# Patient Record
Sex: Male | Born: 1995 | Hispanic: Yes | Marital: Single | Smoking: Never smoker
Health system: Southern US, Community
[De-identification: ages and names within clinical notes are randomized; demographics above are authoritative.]

---
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
## 2015-01-04 IMAGING — CT CT ANGIO CHEST
2 of 8 series · 18 of 46 positions shown · IV contrast (Omni 300)
Comparison: None.

CLINICAL DATA: Dyspnea after recent femur fracture. Elevated
D-dimer.

EXAM:
CT ANGIOGRAPHY CHEST WITH CONTRAST
TECHNIQUE: Multidetector CT imaging of the chest was performed using the
standard protocol during bolus administration of intravenous
contrast. Multiplanar CT image reconstructions and MIPs were
obtained to evaluate the vascular anatomy.
CONTRAST:  100mL OMNIPAQUE IOHEXOL 350 MG/ML SOLN

[Series 5: thins · axial · 0.65mm/px · z∈[+1341,+1574]mm · 15 of 257 slices shown]
[im 12/257  lung]
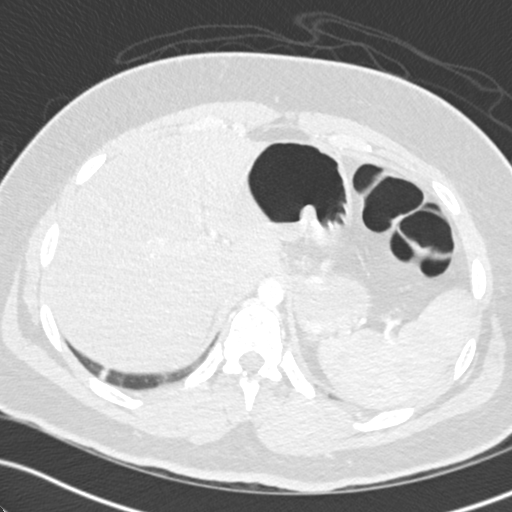
[im 35/257  soft-tissue]
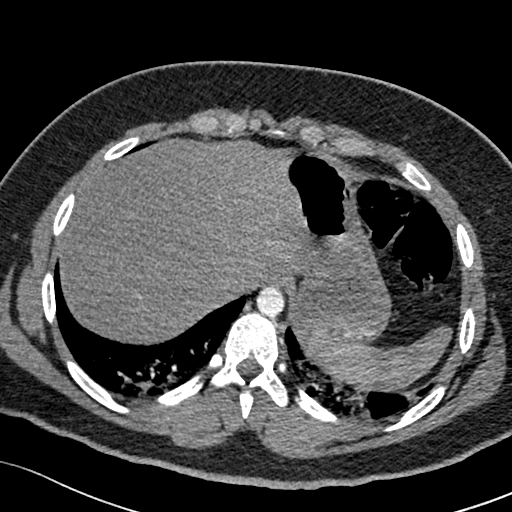
[im 47/257  lung]
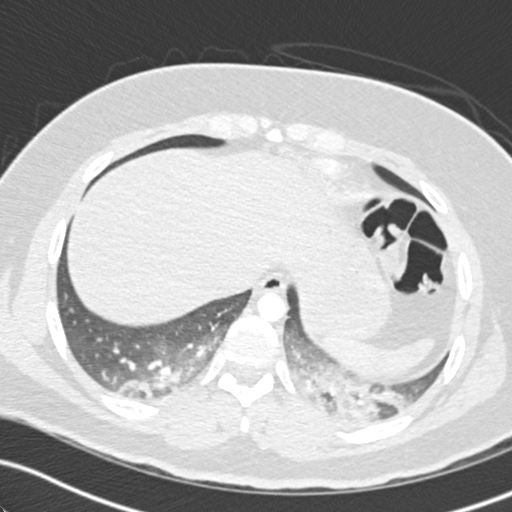
[im 59/257  soft-tissue]
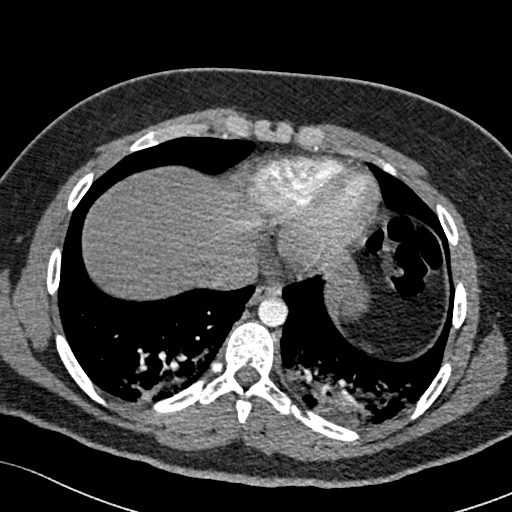
[im 82/257  lung]
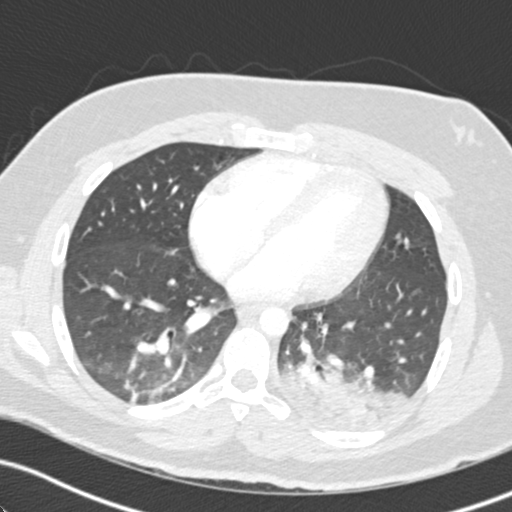
[im 94/257  soft-tissue]
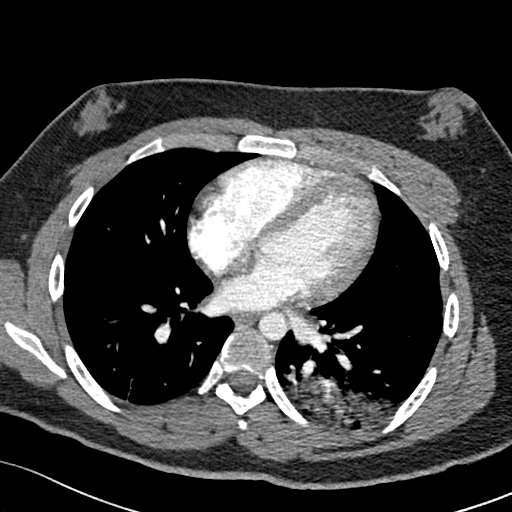
[im 117/257  lung]
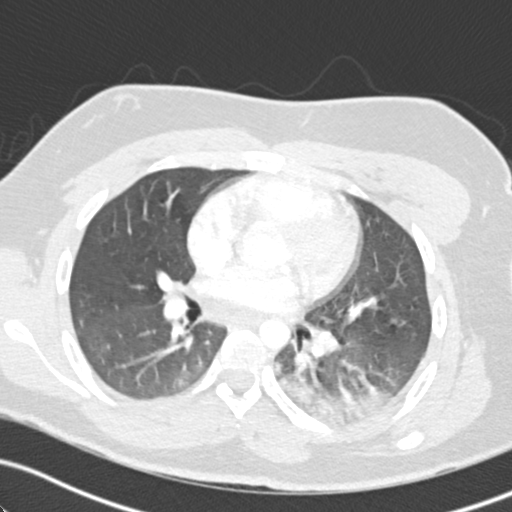
[im 129/257  soft-tissue]
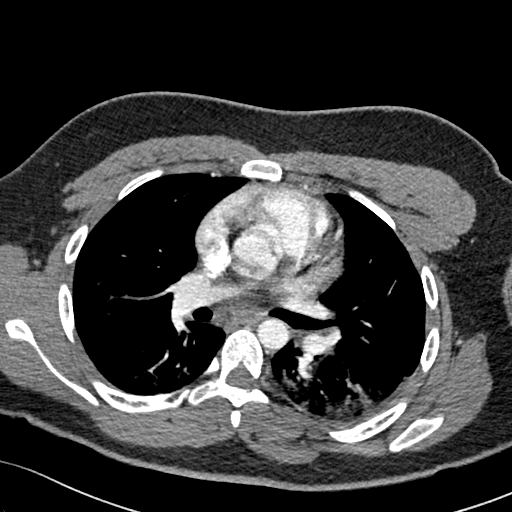
[im 140/257  lung]
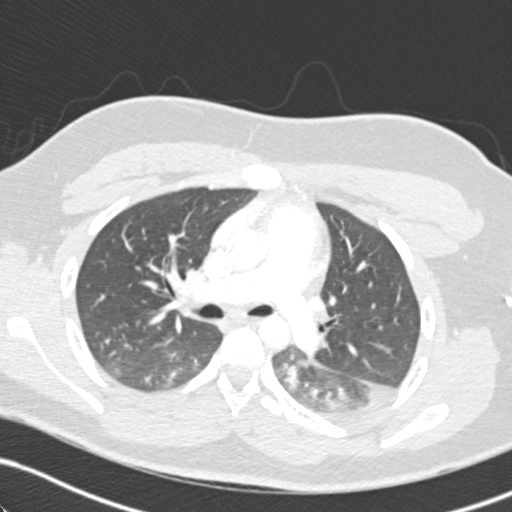
[im 163/257  soft-tissue]
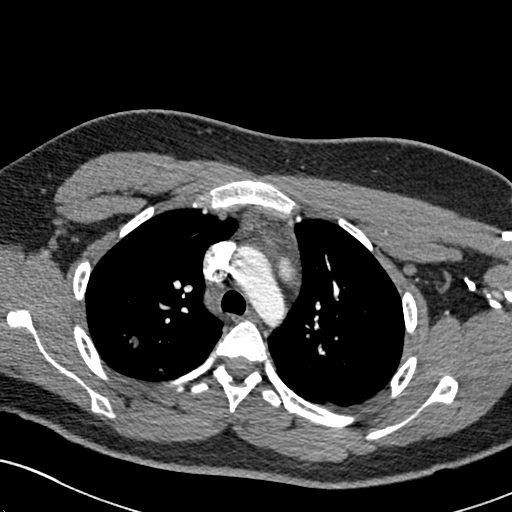
[im 175/257  lung]
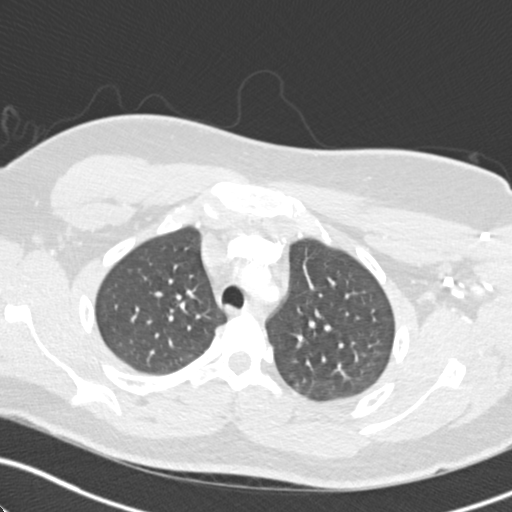
[im 198/257  soft-tissue]
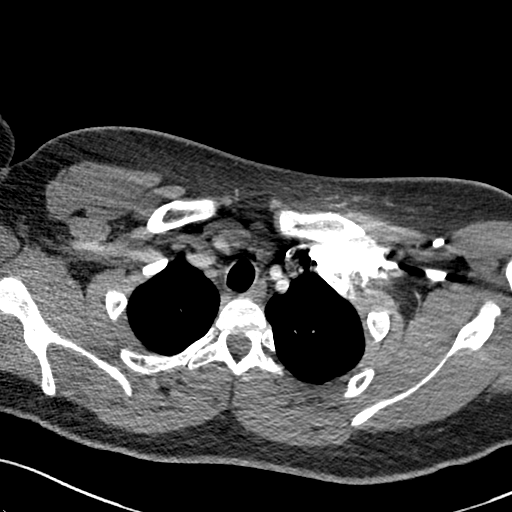
[im 210/257  lung]
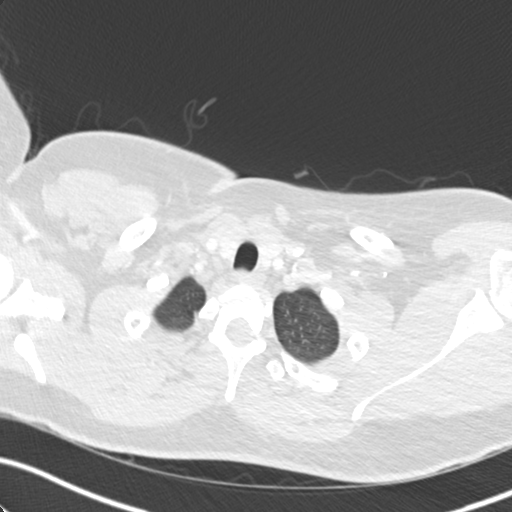
[im 222/257  soft-tissue]
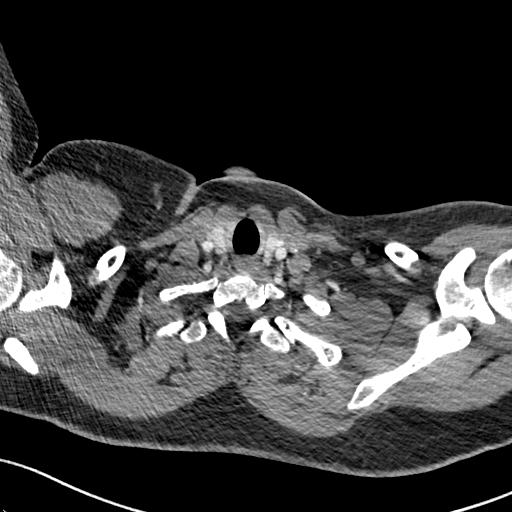
[im 245/257  lung]
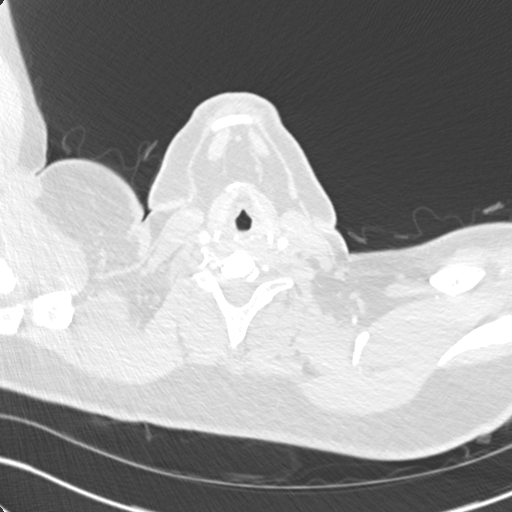

[Series 7: coronal mpr · coronal · 0.51mm/px · 3 of 115 slices shown]
[im 29/115  soft-tissue]
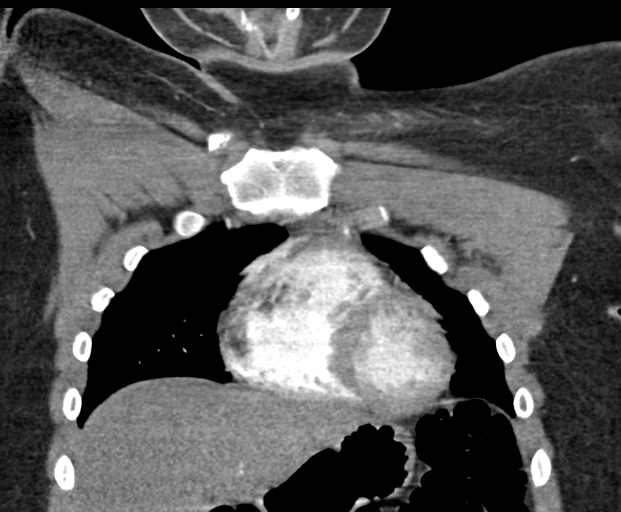
[im 58/115  soft-tissue]
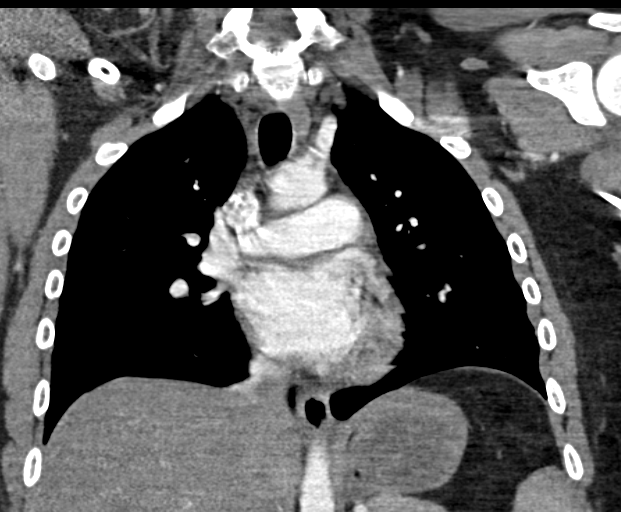
[im 86/115  soft-tissue]
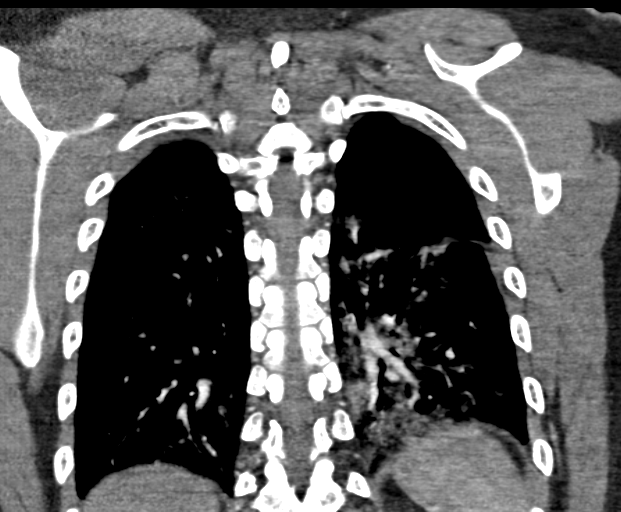

[18 of 46 positions shown; findings below may reference images not displayed]

FINDINGS: THORACIC INLET/BODY WALL:

Symmetric gynecomastia.

MEDIASTINUM:

Normal heart size. No pericardial effusion. No acute vascular
abnormality, including pulmonary embolism. No adenopathy.

LUNG WINDOWS:

There is dependent airspace disease bilaterally, mainly within the
lower lobes. On the left, the opacity is more confluent, both
ground-glass and consolidative. On the right, especially in the
superior segment lower lobe and posterior segment upper lobe, there
is clustered micro nodules, centrilobular. The pattern is not
typical of fat emboli syndrome in this patient with recent intra
medullary nail fixation.

UPPER ABDOMEN:

No acute findings.

OSSEOUS:

No acute fracture.  No suspicious lytic or blastic lesions.

Review of the MIP images confirms the above findings.
IMPRESSION: 1. Negative for pulmonary embolism.
2. Bilateral dependent airspace disease which suggest aspiration
pneumonitis or pneumonia.

## 2015-04-02 ENCOUNTER — Other Ambulatory Visit: Payer: Self-pay | Admitting: Allergy and Immunology

## 2018-03-10 ENCOUNTER — Telehealth: Payer: Self-pay | Admitting: Neurology

## 2018-03-10 ENCOUNTER — Encounter: Payer: Self-pay | Admitting: Neurology

## 2018-03-10 ENCOUNTER — Ambulatory Visit (INDEPENDENT_AMBULATORY_CARE_PROVIDER_SITE_OTHER): Payer: Managed Care, Other (non HMO) | Admitting: Neurology

## 2018-03-10 VITALS — BP 148/90 | HR 73 | Ht 67.5 in | Wt 286.0 lb

## 2018-03-10 DIAGNOSIS — R0683 Snoring: Secondary | ICD-10-CM | POA: Diagnosis not present

## 2018-03-10 DIAGNOSIS — G4489 Other headache syndrome: Secondary | ICD-10-CM | POA: Diagnosis not present

## 2018-03-10 DIAGNOSIS — R51 Headache: Secondary | ICD-10-CM

## 2018-03-10 DIAGNOSIS — R030 Elevated blood-pressure reading, without diagnosis of hypertension: Secondary | ICD-10-CM

## 2018-03-10 DIAGNOSIS — F419 Anxiety disorder, unspecified: Secondary | ICD-10-CM

## 2018-03-10 DIAGNOSIS — G4719 Other hypersomnia: Secondary | ICD-10-CM

## 2018-03-10 DIAGNOSIS — R519 Headache, unspecified: Secondary | ICD-10-CM

## 2018-03-10 NOTE — Progress Notes (Signed)
Subjective:    Patient ID: Nathan Fischer is a 23 y.o. male.  HPI     Huston FoleySaima Beatriz Settles, MD, PhD [facility] [address], [address] [address] [place], [place] [id number]  Dear Dr. Orvan Falconerampbell,  I saw your patient, Nathan Fischer, upon your kind request in my neurologic clinic today for initial consultation of his headaches. The patient is unaccompanied today. As you know, Mr. [name] is a 23 year old right-handed woman with an underlying medical history of morbid obesity with BMI of over 40, who reports recurrent headaches for the past 2+ week, nearly daily. I reviewed your office note from 02/28/2018. He was presented to the emergency room on 02/26/2018, emergency room records were not available for my review today from [facility]. He had recent symptomatic treatment with a muscle relaxant and Toradol injection. He reports that the medicines did not help. He has tried over-the-counter medicines such as Excedrin. He describes a sharp stabbing like pain in the right frontal area with radiation to the top and backwards, even down the neck. He has woken up with a headache. He denies photophobia, nausea or vomiting. He denies a prior history of migraine headaches are a family history of migraines. He has had increase in anxiety. He has had increased blood pressure values and has had intermittent chest tightness for which he has not sought medical attention yet. He has seen a psychiatrist for anxiety and has tried Well[employer] geologistVraylar recently, took it for about 2 months but had side effects and stopped it. He has a follow-up pending with his psychiatrist. He had his last eye examination over a year ago and is due for a checkup. He is a nonsmoker and does not utilize alcohol and drinks caffeine about 1-2 servings daily in the form of coffee or soda. He tries to hydrate well with water. He has not been sleeping well and wakes up very tight in the muscles he feels. He does snore. He has woken  up with a headache. He had asthma as a child but outgrew it. He denies any one-sided weakness or numbness or tingling or droopy face or slurring of speech. He is worried about his elevated blood pressure today, which came down upon recheck towards the end of the visit. He is single, lives with his mother maternal grandmother, no children. He works at the [employer] [place]  His Epworth sleepiness score is 5 out of 24, fatigue score is 28 out of 63.  His Past Medical History Is Significant For: No past medical history on file.  His Past Surgical History Is Significant For: Past Surgical History:  Procedure Laterality Date  . FEMUR IM NAIL Left 10/20/2013   Procedure: LEFT FEMORAL INTRAMEDULLARY (IM) NAIL;  Surgeon: Cheral AlmasNaiping Michael Xu, MD;  Location: [facility] OR;  Service: Orthopedics;  Laterality: Left;    His Family History Is Significant For: Family History  Problem Relation Age of Onset  . Hypertension Maternal Grandmother   . Hyperlipidemia Maternal Grandmother     His Social History Is Significant For: Social History   Socioeconomic History  . Marital status: Single    Spouse name: Not on file  . Number of children: Not on file  . Years of education: Not on file  . Highest education level: Not on file  Occupational History  . Not on file  Social Needs  . Financial resource strain: Not on file  . Food insecurity:    Worry: Not on file    Inability: Not  on file  . Transportation needs:    Medical: Not on file    Non-medical: Not on file  Tobacco Use  . Smoking status: Never Smoker  . Smokeless tobacco: Never Used  Substance and Sexual Activity  . Alcohol use: Not on file  . Drug use: Not on file  . Sexual activity: Not on file  Lifestyle  . Physical activity:    Days per week: Not on file    Minutes per session: Not on file  . Stress: Not on file  Relationships  . Social connections:    Talks on phone: Not on file    Gets together: Not on file    Attends  religious service: Not on file    Active member of club or organization: Not on file    Attends meetings of clubs or organizations: Not on file    Relationship status: Not on file  Other Topics Concern  . Not on file  Social History Narrative  . Not on file    His Allergies Are:  Allergies  Allergen Reactions  . Cashew Nut Oil Anaphylaxis  . Amoxicillin   . Peanuts [Peanut Oil]   . Penicillins   :   His Current Medications Are:  Outpatient Encounter Medications as of 03/10/2018  Medication Sig  . [DISCONTINUED] aspirin EC 325 MG tablet Take 1 tablet (325 mg total) by mouth 2 (two) times daily.  . [DISCONTINUED] EPIPEN 2-PAK 0.3 MG/0.3ML SOAJ injection USE AS DIRECTED FOR LIFE THREATENING ALLERGIC REACTIONS  . [DISCONTINUED] methocarbamol (ROBAXIN) 750 MG tablet Take 1 tablet (750 mg total) by mouth 2 (two) times daily as needed for muscle spasms.  . [DISCONTINUED] oxyCODONE (OXY IR/ROXICODONE) 5 MG immediate release tablet Take 1-3 tablets (5-15 mg total) by mouth every 4 (four) hours as needed.  . [DISCONTINUED] senna (SENOKOT) 8.6 MG TABS tablet Take 1 tablet (8.6 mg total) by mouth 2 (two) times daily.  . [DISCONTINUED] tiZANidine (ZANAFLEX) 2 MG tablet Take 2 mg by mouth every 8 (eight) hours as needed for muscle spasms.   No facility-administered encounter medications on file as of 03/10/2018.   :  Review of Systems:  Out of a complete 14 point review of systems, all are reviewed and negative with the exception of these symptoms as listed below:  Review of Systems  Neurological:       Pt presents today to discuss a headache that he has had for 16 days. He has tried tizanidine, excedrin, and OTC NSAIDs. Pt wakes up with the headache. Pt denies any significant nausea, photophobia, or phonophobia. Pt has never had a sleep study but does endorse snoring.  Epworth Sleepiness Scale 0= would never doze 1= slight chance of dozing 2= moderate chance of dozing 3= high chance of  dozing  Sitting and reading: 0 Watching TV: 1 Sitting inactive in a public place (ex. Theater or meeting): 0 As a passenger in a car for an hour without a break: 1 Lying down to rest in the afternoon: 3 Sitting and talking to someone: 0 Sitting quietly after lunch (no alcohol): 0 In a car, while stopped in traffic: 0 Total: 5     Objective:  Neurological Exam  Physical Exam Physical Examination:   Vitals:   03/10/18 1136  BP: (!) 167/99  Pulse: 73    General Examination: The patient is a very pleasant 23 y.o. male in no acute distress. He appears well-developed and well-nourished and well groomed. He is anxious appearing. Blood pressure  upon recheck was 148/90.  HEENT: Normocephalic, atraumatic, pupils are equal, round and reactive to light and accommodation. Funduscopic exam is normal with sharp disc margins noted. Extraocular tracking is good without limitation to gaze excursion or nystagmus noted. Corrective eyeglasses in place. Hearing is grossly intact. Face is symmetric with normal facial animation and normal facial sensation. Speech is clear with no dysarthria noted. There is no hypophonia. There is no lip, neck/head, jaw or voice tremor. Neck is supple with full range of passive and active motion. There are no carotid bruits on auscultation. Oropharynx exam reveals: moderate mouth dryness, good dental hygiene and moderate airway crowding, due to tonsils are 2-3+ bilaterally, longer uvula, Mallampati class II. Neck circumference is 19-1/4 inches. Tongue protrudes centrally and palate elevates symmetrically.   Chest: Clear to auscultation without wheezing, rhonchi or crackles noted.  Heart: S1+S2+0, regular and normal without murmurs, rubs or gallops noted.   Abdomen: Soft, non-tender and non-distended with normal bowel sounds appreciated on auscultation.  Extremities: There is no pitting edema in the distal lower extremities bilaterally.   Skin: Warm and dry without  trophic changes noted.  Musculoskeletal: exam reveals no obvious joint deformities, tenderness or joint swelling or erythema.   Neurologically:  Mental status: The patient is awake, alert and oriented in all 4 spheres. His immediate and remote memory, attention, language skills and fund of knowledge are appropriate. There is no evidence of aphasia, agnosia, apraxia or anomia. Speech is clear with normal prosody and enunciation. Thought process is linear. Mood is normal and affect is normal, anxious.  Cranial nerves II - XII are as described above under HEENT exam. In addition: shoulder shrug is normal with equal shoulder height noted. Motor exam: Normal bulk, strength and tone is noted. There is no drift, tremor or rebound. Romberg is negative. Reflexes are 2-3+ throughout. Fine motor skills and coordination: intact with normal finger taps, normal hand movements, normal rapid alternating patting, normal foot taps and normal foot agility.  Cerebellar testing: No dysmetria or intention tremor. There is no truncal or gait ataxia.  Sensory exam: intact to light touch in the upper and lower extremities.  Gait, station and balance: He stands easily. No veering to one side is noted. No leaning to one side is noted. Posture is age-appropriate and stance is narrow based. Gait shows normal stride length and normal pace. No problems turning are noted. Tandem walk is unremarkable.   Assessment and Plan:  In summary, Umair Graddick is a very pleasant 23 y.o.-year old male with an underlying medical history of morbid obesity with BMI of over 40, who presents for new onset recurrent headaches of 2+ weeks duration. His description and history do not suggest migrainous headaches, more likely he has tension-type headaches, he also reports suboptimally controlled anxiety. He has had intermittent chest tightness for which he is advised to follow-up with your office and also for his concern for elevated blood pressure  values. Blood pressure in your office was 158/80 on 02/28/2018. Neurologically, his exam is nonfocal which is reassuring. Nevertheless, had several suggestions for him today. He is advised to schedule a follow-up with his eye doctor to update his eye examination. He is furthermore advised to stay well rested and well hydrated, we will do a sleep study to rule out underlying obstructive sleep apnea as he is at risk for this given his obesity, crowded airway, history of snoring and morning headaches. His recurrent headaches may be a combination of increased tension/anxiety, elevated blood pressure  values, possibly underlying obstructive sleep apnea. We will also proceed with a brain MRI with and without contrast to rule out any structural cause of his right frontal headache. I did not suggest any new medications and he is not keen on starting any medication at this time, he would like to get to the bottom of his symptoms first. He is also due for follow-up with his psychiatrist and is advised to keep that appointment. I will see him back after testing is completed. We will keep you posted in the interim as to his test results by phone call. I answered all his questions today and he was in agreement. Thank you very much for allowing me to participate in the care of this nice patient. If I can be of any further assistance to you please do not hesitate to call me at [phone].  Sincerely,   Huston Foley, MD, PhD

## 2018-03-10 NOTE — Telephone Encounter (Signed)
[employer] order sent to GI pt is aware. They will obtain the auth and reach out to the patient. The patient is aware.

## 2018-03-10 NOTE — Patient Instructions (Addendum)
Please make an appointment with [facility] in [place] for your check up.  Please talk to your primary care about your chest tightness.  Your neuro exam looks good.  Please follow up with your psychiatrist for anxiety management.  No new medications yet from my end of things. You may have tension headaches, non-migrainous.  We will do a brain scan, called MRI and call you with the test results. We will have to schedule you for this on a separate date. This test requires authorization from your insurance, and we will take care of the insurance process. We will do a sleep study to rule out sleep apnea.

## 2018-03-11 LAB — COMPREHENSIVE METABOLIC PANEL
ALBUMIN: 4.4 g/dL (ref 3.5–5.5)
ALK PHOS: 90 IU/L (ref 39–117)
ALT: 42 IU/L (ref 0–44)
AST: 20 IU/L (ref 0–40)
Albumin/Globulin Ratio: 1.5 (ref 1.2–2.2)
BUN / CREAT RATIO: 13 (ref 9–20)
BUN: 11 mg/dL (ref 6–20)
Bilirubin Total: 0.3 mg/dL (ref 0.0–1.2)
CO2: 22 mmol/L (ref 20–29)
Calcium: 9.6 mg/dL (ref 8.7–10.2)
Chloride: 103 mmol/L (ref 96–106)
Creatinine, Ser: 0.86 mg/dL (ref 0.76–1.27)
GFR calc Af Amer: 142 mL/min/{1.73_m2} (ref >59)
GFR calc non Af Amer: 123 mL/min/{1.73_m2} (ref >59)
Globulin, Total: 3 g/dL (ref 1.5–4.5)
Glucose: 77 mg/dL (ref 65–99)
Potassium: 4.5 mmol/L (ref 3.5–5.2)
Sodium: 142 mmol/L (ref 134–144)
Total Protein: 7.4 g/dL (ref 6.0–8.5)

## 2018-03-21 NOTE — Telephone Encounter (Signed)
Rutherford Nail: [id number] (exp. 03/16/18 to 06/14/18) patient was scheduled for 03/22/18 and pt cancelled appt.

## 2018-03-22 ENCOUNTER — Telehealth: Payer: Self-pay | Admitting: Neurology

## 2018-03-22 ENCOUNTER — Other Ambulatory Visit: Payer: Commercial Indemnity

## 2018-03-22 NOTE — Telephone Encounter (Signed)
Keturah Shavers From [employer] is needing [place] to fax over the MRI w w/o Contrast orders to [facility]. The fax num given is [phone]. Please advise.

## 2018-03-22 NOTE — Telephone Encounter (Signed)
I faxed the order to [facility].

## 2018-08-03 ENCOUNTER — Telehealth: Payer: Self-pay

## 2018-08-03 NOTE — Telephone Encounter (Signed)
We have attempted to call the patient two times to schedule sleep study.  Patient has been unavailable at the phone numbers we have on file and has not returned our calls.  At this point we will send a letter asking patient to please contact the sleep lab to schedule their sleep study.  If patient calls back we will schedule them for their sleep study. 

## 2023-01-05 DIAGNOSIS — H18831 Recurrent erosion of cornea, right eye: Secondary | ICD-10-CM | POA: Diagnosis not present

## 2023-01-07 DIAGNOSIS — H18831 Recurrent erosion of cornea, right eye: Secondary | ICD-10-CM | POA: Diagnosis not present

## 2023-01-11 DIAGNOSIS — H18831 Recurrent erosion of cornea, right eye: Secondary | ICD-10-CM | POA: Diagnosis not present

## 2023-01-18 DIAGNOSIS — H18831 Recurrent erosion of cornea, right eye: Secondary | ICD-10-CM | POA: Diagnosis not present

## 2023-02-16 DIAGNOSIS — H18831 Recurrent erosion of cornea, right eye: Secondary | ICD-10-CM | POA: Diagnosis not present

## 2023-06-02 DIAGNOSIS — Z6841 Body Mass Index (BMI) 40.0 and over, adult: Secondary | ICD-10-CM | POA: Diagnosis not present

## 2023-06-02 DIAGNOSIS — M5416 Radiculopathy, lumbar region: Secondary | ICD-10-CM | POA: Diagnosis not present

## 2023-06-14 DIAGNOSIS — Z6841 Body Mass Index (BMI) 40.0 and over, adult: Secondary | ICD-10-CM | POA: Diagnosis not present

## 2023-06-14 DIAGNOSIS — M5416 Radiculopathy, lumbar region: Secondary | ICD-10-CM | POA: Diagnosis not present
